# Patient Record
Sex: Male | Born: 1985 | Race: White | Hispanic: No | Marital: Married | Smoking: Never smoker
Health system: Southern US, Community
[De-identification: ages and names within clinical notes are randomized; demographics above are authoritative.]

## PROBLEM LIST (undated history)

## (undated) DIAGNOSIS — S60459A Superficial foreign body of unspecified finger, initial encounter: Secondary | ICD-10-CM

## (undated) DIAGNOSIS — R51 Headache: Secondary | ICD-10-CM

## (undated) HISTORY — PX: NO PAST SURGERIES: SHX2092

---

## 2006-09-05 ENCOUNTER — Emergency Department (HOSPITAL_COMMUNITY): Admission: EM | Admit: 2006-09-05 | Discharge: 2006-09-06 | Payer: Self-pay | Admitting: Emergency Medicine

## 2006-12-19 ENCOUNTER — Observation Stay (HOSPITAL_COMMUNITY): Admission: EM | Admit: 2006-12-19 | Discharge: 2006-12-20 | Payer: Self-pay | Admitting: Emergency Medicine

## 2006-12-19 ENCOUNTER — Ambulatory Visit: Payer: Self-pay | Admitting: Psychiatry

## 2008-12-29 ENCOUNTER — Emergency Department (HOSPITAL_COMMUNITY): Admission: EM | Admit: 2008-12-29 | Discharge: 2008-12-29 | Payer: Self-pay | Admitting: Emergency Medicine

## 2009-04-01 ENCOUNTER — Emergency Department (HOSPITAL_COMMUNITY): Admission: EM | Admit: 2009-04-01 | Discharge: 2009-04-01 | Payer: Self-pay | Admitting: Emergency Medicine

## 2011-03-31 LAB — URINALYSIS, ROUTINE W REFLEX MICROSCOPIC
Glucose, UA: NEGATIVE mg/dL
Nitrite: NEGATIVE
Protein, ur: NEGATIVE mg/dL
Specific Gravity, Urine: 1.004 — ABNORMAL LOW (ref 1.005–1.030)
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
## 2011-05-02 NOTE — Discharge Summary (Signed)
NAME:  Phillip Patrick, Phillip Patrick NO.:  [id number]   MEDICAL RECORD NO.:  [id number]          PATIENT TYPE:  OBV   LOCATION:  2039                         FACILITY:  [facility]   PHYSICIAN:  Madaline Savage, MD        DATE OF BIRTH:  1986-01-29   DATE OF ADMISSION:  12/19/2006  DATE OF DISCHARGE:  12/20/2006                               DISCHARGE SUMMARY   PRIMARY CARE PHYSICIAN:  The patient is unassigned to [place].   PSYCHIATRY:  Dr. Electa Sniff saw from Psychiatry.   DISCHARGE DIAGNOSES:  1. Accidental heroin overdose.  2. Heroin dependence.  3. Leukocytosis which has resolved.   DISCHARGE MEDICATIONS:  None.   HISTORY OF PRESENT ILLNESS:  Mr. Duce is a 25 year old gentleman who  has a history of heroin dependence and IV drug abuse who was admitted  after he was found unresponsive.  En route he was given Narcan and he  woke up.  He has been a heroin addict in the past and he has gone  through heroin detox in November, 2007, for a month.  At this time he  says he was not suicidal.  He tried to take his normal dose of heroin  but he thinks it was a more pure form and that is why it affected him.   PROBLEM LIST:  1. Heroin overdose.  Since admission he has been more awake and now he      is back to his baseline, his breathing is at normal and he is      stable from a medical standpoint.  2. Leukocytosis.  On admission he had a slightly elevated white count      which has resolved to normal.  His x-ray chest was negative.   DISPOSITION:  1. He is now being discharged home.  2. The family is taking him to [facility] for inpatient      rehab.  They will take him directly from here to [facility] for      an interview and possibly get him admitted there tonight.  3. He will follow with his regular doctors with his regular      appointment.      Madaline Savage, MD  Electronically Signed     PKN/MEDQ  D:  12/20/2006  T:  12/20/2006  Job:  [id number]

## 2012-08-15 DIAGNOSIS — S60459A Superficial foreign body of unspecified finger, initial encounter: Secondary | ICD-10-CM

## 2012-08-15 HISTORY — DX: Superficial foreign body of unspecified finger, initial encounter: S60.459A

## 2012-08-20 ENCOUNTER — Encounter (HOSPITAL_BASED_OUTPATIENT_CLINIC_OR_DEPARTMENT_OTHER): Payer: Self-pay | Admitting: *Deleted

## 2012-08-24 ENCOUNTER — Other Ambulatory Visit: Payer: Self-pay | Admitting: Orthopedic Surgery

## 2012-08-27 ENCOUNTER — Encounter (HOSPITAL_BASED_OUTPATIENT_CLINIC_OR_DEPARTMENT_OTHER): Admission: RE | Payer: Self-pay | Source: Ambulatory Visit

## 2012-08-27 ENCOUNTER — Ambulatory Visit (HOSPITAL_BASED_OUTPATIENT_CLINIC_OR_DEPARTMENT_OTHER)
Admission: RE | Admit: 2012-08-27 | Payer: Commercial Managed Care - PPO | Source: Ambulatory Visit | Admitting: Orthopedic Surgery

## 2012-08-27 HISTORY — DX: Headache: R51

## 2012-08-27 HISTORY — DX: Superficial foreign body of unspecified finger, initial encounter: S60.459A

## 2012-08-27 SURGERY — FOREIGN BODY REMOVAL ADULT
Anesthesia: Choice | Laterality: Left

## 2017-04-23 DIAGNOSIS — R0981 Nasal congestion: Secondary | ICD-10-CM | POA: Diagnosis not present

## 2017-04-23 DIAGNOSIS — R05 Cough: Secondary | ICD-10-CM | POA: Diagnosis not present

## 2017-04-23 DIAGNOSIS — J00 Acute nasopharyngitis [common cold]: Secondary | ICD-10-CM | POA: Diagnosis not present

## 2018-06-24 DIAGNOSIS — K625 Hemorrhage of anus and rectum: Secondary | ICD-10-CM | POA: Diagnosis not present

## 2018-09-29 ENCOUNTER — Emergency Department: Payer: Commercial Managed Care - PPO

## 2018-09-29 ENCOUNTER — Encounter: Payer: Self-pay | Admitting: Emergency Medicine

## 2018-09-29 ENCOUNTER — Emergency Department
Admission: EM | Admit: 2018-09-29 | Discharge: 2018-09-29 | Disposition: A | Discharge status: Home / Self Care | Payer: Commercial Managed Care - PPO | Attending: Emergency Medicine | Admitting: Emergency Medicine

## 2018-09-29 DIAGNOSIS — R202 Paresthesia of skin: Secondary | ICD-10-CM | POA: Diagnosis not present

## 2018-09-29 DIAGNOSIS — R0602 Shortness of breath: Secondary | ICD-10-CM | POA: Insufficient documentation

## 2018-09-29 DIAGNOSIS — R0789 Other chest pain: Secondary | ICD-10-CM | POA: Diagnosis not present

## 2018-09-29 DIAGNOSIS — R079 Chest pain, unspecified: Secondary | ICD-10-CM | POA: Diagnosis not present

## 2018-09-29 DIAGNOSIS — R531 Weakness: Secondary | ICD-10-CM | POA: Diagnosis not present

## 2018-09-29 LAB — CBC
HCT: 49.1 % (ref 39.0–52.0)
Hemoglobin: 16.5 g/dL (ref 13.0–17.0)
MCH: 30.2 pg (ref 26.0–34.0)
MCHC: 33.6 g/dL (ref 30.0–36.0)
MCV: 89.9 fL (ref 80.0–100.0)
PLATELETS: 231 10*3/uL (ref 150–400)
RBC: 5.46 MIL/uL (ref 4.22–5.81)
RDW: 11.7 % (ref 11.5–15.5)
WBC: 6.8 10*3/uL (ref 4.0–10.5)
nRBC: 0 % (ref 0.0–0.2)

## 2018-09-29 LAB — TROPONIN I: Troponin I: <0.03 ng/mL (ref <0.03)

## 2018-09-29 LAB — BASIC METABOLIC PANEL
ANION GAP: 10 (ref 5–15)
BUN: 12 mg/dL (ref 6–20)
CALCIUM: 9.2 mg/dL (ref 8.9–10.3)
CO2: 28 mmol/L (ref 22–32)
Chloride: 104 mmol/L (ref 98–111)
Creatinine, Ser: 1.27 mg/dL — ABNORMAL HIGH (ref 0.61–1.24)
GFR calc Af Amer: >60 mL/min (ref >60)
GLUCOSE: 113 mg/dL — AB (ref 70–99)
Potassium: 4.1 mmol/L (ref 3.5–5.1)
SODIUM: 142 mmol/L (ref 135–145)

## 2018-09-29 NOTE — ED Triage Notes (Signed)
Patient presents to the ED with chest pain that began yesterday evening and has been intermittent since then.  Patient states he has had some numbness to his left hand. Patient describes chest pain as "tightness".  Patient reports intermittent headache as well.  Patient states, "I've always had a suspicion that I might have multiple sclerosis because I've looked up the symptoms and I have all of them.  Patient reports frequent urination, inability to empty bowels and feeling pins and needles."  Patient states symptoms have been progressing over the past 10 years.
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
## 2018-09-29 NOTE — ED Notes (Signed)
Pt c/o chest tightness that started yesterday with tingling in face, hands and feet with SOB intermittently. Pt is in NAD upon arrival to the, respirations WNL. Skin is warm and dry.

## 2018-10-01 DIAGNOSIS — Z87821 Personal history of retained foreign body fully removed: Secondary | ICD-10-CM | POA: Diagnosis not present

## 2018-10-01 DIAGNOSIS — S60453A Superficial foreign body of left middle finger, initial encounter: Secondary | ICD-10-CM | POA: Diagnosis not present

## 2018-10-04 DIAGNOSIS — R002 Palpitations: Secondary | ICD-10-CM | POA: Diagnosis not present

## 2018-10-04 DIAGNOSIS — K921 Melena: Secondary | ICD-10-CM | POA: Diagnosis not present

## 2018-10-18 DIAGNOSIS — K219 Gastro-esophageal reflux disease without esophagitis: Secondary | ICD-10-CM | POA: Diagnosis not present

## 2018-10-18 DIAGNOSIS — K921 Melena: Secondary | ICD-10-CM | POA: Diagnosis not present

## 2018-11-08 DIAGNOSIS — K2930 Chronic superficial gastritis without bleeding: Secondary | ICD-10-CM | POA: Diagnosis not present

## 2018-11-08 DIAGNOSIS — K921 Melena: Secondary | ICD-10-CM | POA: Diagnosis not present

## 2018-11-08 DIAGNOSIS — D125 Benign neoplasm of sigmoid colon: Secondary | ICD-10-CM | POA: Diagnosis not present

## 2018-11-08 DIAGNOSIS — K219 Gastro-esophageal reflux disease without esophagitis: Secondary | ICD-10-CM | POA: Diagnosis not present

## 2018-11-14 DIAGNOSIS — R202 Paresthesia of skin: Secondary | ICD-10-CM

## 2018-11-14 DIAGNOSIS — R200 Anesthesia of skin: Secondary | ICD-10-CM | POA: Insufficient documentation

## 2018-11-14 DIAGNOSIS — R42 Dizziness and giddiness: Secondary | ICD-10-CM | POA: Insufficient documentation

## 2018-11-14 DIAGNOSIS — R079 Chest pain, unspecified: Secondary | ICD-10-CM | POA: Insufficient documentation

## 2018-11-14 NOTE — Progress Notes (Deleted)
NO SHOW

## 2018-11-15 ENCOUNTER — Ambulatory Visit: Payer: Commercial Managed Care - PPO | Admitting: Cardiovascular Disease

## 2018-11-16 ENCOUNTER — Encounter: Payer: Self-pay | Admitting: Cardiovascular Disease

## 2019-04-18 DIAGNOSIS — J310 Chronic rhinitis: Secondary | ICD-10-CM | POA: Diagnosis not present

## 2019-04-18 DIAGNOSIS — R509 Fever, unspecified: Secondary | ICD-10-CM | POA: Diagnosis not present

## 2019-04-18 DIAGNOSIS — J029 Acute pharyngitis, unspecified: Secondary | ICD-10-CM | POA: Diagnosis not present

## 2019-04-22 DIAGNOSIS — R509 Fever, unspecified: Secondary | ICD-10-CM | POA: Diagnosis not present

## 2019-04-22 DIAGNOSIS — R6889 Other general symptoms and signs: Secondary | ICD-10-CM | POA: Diagnosis not present

## 2019-04-22 DIAGNOSIS — R05 Cough: Secondary | ICD-10-CM | POA: Diagnosis not present

## 2019-04-24 ENCOUNTER — Other Ambulatory Visit: Payer: Self-pay

## 2019-04-24 ENCOUNTER — Emergency Department
Admission: EM | Admit: 2019-04-24 | Discharge: 2019-04-25 | Disposition: A | Discharge status: Home / Self Care | Payer: Commercial Managed Care - PPO | Attending: Emergency Medicine | Admitting: Emergency Medicine

## 2019-04-24 ENCOUNTER — Encounter: Payer: Self-pay | Admitting: Emergency Medicine

## 2019-04-24 ENCOUNTER — Emergency Department: Payer: Commercial Managed Care - PPO

## 2019-04-24 DIAGNOSIS — R0602 Shortness of breath: Secondary | ICD-10-CM | POA: Diagnosis not present

## 2019-04-24 DIAGNOSIS — Z20828 Contact with and (suspected) exposure to other viral communicable diseases: Secondary | ICD-10-CM | POA: Insufficient documentation

## 2019-04-24 DIAGNOSIS — R109 Unspecified abdominal pain: Secondary | ICD-10-CM | POA: Diagnosis not present

## 2019-04-24 DIAGNOSIS — B9721 SARS-associated coronavirus as the cause of diseases classified elsewhere: Secondary | ICD-10-CM | POA: Insufficient documentation

## 2019-04-24 DIAGNOSIS — R509 Fever, unspecified: Secondary | ICD-10-CM

## 2019-04-24 DIAGNOSIS — B349 Viral infection, unspecified: Secondary | ICD-10-CM | POA: Diagnosis not present

## 2019-04-24 DIAGNOSIS — R42 Dizziness and giddiness: Secondary | ICD-10-CM

## 2019-04-24 DIAGNOSIS — R05 Cough: Secondary | ICD-10-CM

## 2019-04-24 DIAGNOSIS — R079 Chest pain, unspecified: Secondary | ICD-10-CM

## 2019-04-24 DIAGNOSIS — R059 Cough, unspecified: Secondary | ICD-10-CM

## 2019-04-24 DIAGNOSIS — U071 COVID-19: Secondary | ICD-10-CM | POA: Diagnosis not present

## 2019-04-24 LAB — BASIC METABOLIC PANEL
Anion gap: 9 (ref 5–15)
BUN: 11 mg/dL (ref 6–20)
CO2: 28 mmol/L (ref 22–32)
Calcium: 8.1 mg/dL — ABNORMAL LOW (ref 8.9–10.3)
Chloride: 101 mmol/L (ref 98–111)
Creatinine, Ser: 1.17 mg/dL (ref 0.61–1.24)
GFR calc Af Amer: >60 mL/min (ref >60)
GFR calc non Af Amer: >60 mL/min (ref >60)
Glucose, Bld: 128 mg/dL — ABNORMAL HIGH (ref 70–99)
Potassium: 3.5 mmol/L (ref 3.5–5.1)
Sodium: 138 mmol/L (ref 135–145)

## 2019-04-24 LAB — CBC
HCT: 46.3 % (ref 39.0–52.0)
Hemoglobin: 15.2 g/dL (ref 13.0–17.0)
MCH: 30.2 pg (ref 26.0–34.0)
MCHC: 32.8 g/dL (ref 30.0–36.0)
MCV: 91.9 fL (ref 80.0–100.0)
Platelets: 161 10*3/uL (ref 150–400)
RBC: 5.04 MIL/uL (ref 4.22–5.81)
RDW: 11.7 % (ref 11.5–15.5)
WBC: 6.8 10*3/uL (ref 4.0–10.5)
nRBC: 0 % (ref 0.0–0.2)

## 2019-04-24 LAB — TROPONIN I: Troponin I: <0.03 ng/mL (ref <0.03)

## 2019-04-24 LAB — SARS CORONAVIRUS 2 BY RT PCR (HOSPITAL ORDER, PERFORMED IN ~~LOC~~ HOSPITAL LAB): SARS Coronavirus 2: POSITIVE — AB

## 2019-04-24 MED ORDER — HYDROCOD POLST-CPM POLST ER 10-8 MG/5ML PO SUER: 5.0000 mL | Freq: Two times a day (BID) | ORAL | 0 refills | Status: DC

## 2019-04-24 MED ORDER — SODIUM CHLORIDE 0.9% FLUSH: 3.0000 mL | Freq: Once | INTRAVENOUS | Status: DC

## 2019-04-24 MED ORDER — SODIUM CHLORIDE 0.9 % IV SOLN
Freq: Once | INTRAVENOUS | Status: AC
  Administered 2019-04-24: 23:00:00 via INTRAVENOUS

## 2019-04-24 MED ORDER — KETOROLAC TROMETHAMINE 30 MG/ML IJ SOLN
30.0000 mg | Freq: Once | INTRAMUSCULAR | Status: AC
  Administered 2019-04-24: 30 mg via INTRAVENOUS
  Filled 2019-04-24: qty 1

## 2019-04-24 MED ORDER — ONDANSETRON HCL 4 MG/2ML IJ SOLN
4.0000 mg | Freq: Once | INTRAMUSCULAR | Status: AC
  Administered 2019-04-24: 23:00:00 4 mg via INTRAVENOUS
  Filled 2019-04-24: qty 2

## 2019-04-24 NOTE — ED Notes (Signed)
Patient is waiting for NS bolus to finish and results of Covid - 19 test before being discharged.

## 2019-04-24 NOTE — ED Triage Notes (Addendum)
Pt reports dizziness, shortness of breath and abd pain that started 1 week ago; temp earlier at home was 100.4; unknown exposure to Covid 19; pt also has nonproductive cough; pt took tylenol around 6pm; last ibuprofen was 1-2pm; pt awake and alert; talking in complete coherent sentences; pt says he was tested for Covid 19 on Friday but does not know results; pt adds in the middle of triage that he has also had intermittent chest pain to the center of his chest and diaphoresis
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
## 2019-04-24 NOTE — ED Notes (Signed)
Dr. Beather Arbour aware of Covid-19 positive swab.

## 2019-04-25 NOTE — ED Notes (Signed)
Patient witnessed signing [employer] paper. Patient took that paper with him.

## 2019-04-25 NOTE — ED Notes (Signed)
Patient left with mask on and information sheet about Covid-19 precautions.

## 2019-05-18 ENCOUNTER — Telehealth: Payer: Self-pay | Admitting: *Deleted

## 2019-05-18 NOTE — Telephone Encounter (Signed)
I called pt to see if he would be willing to donate plasma since he is recovered from the COVID-19 to assist in the recovery in others with the virus.  I got his voicemail.   No message left.   I will try again.

## 2020-04-11 ENCOUNTER — Encounter (HOSPITAL_COMMUNITY): Payer: Self-pay | Admitting: *Deleted

## 2020-04-11 ENCOUNTER — Emergency Department (HOSPITAL_COMMUNITY)
Admission: EM | Admit: 2020-04-11 | Discharge: 2020-04-11 | Disposition: A | Discharge status: Home / Self Care | Payer: 59 | Attending: Emergency Medicine | Admitting: Emergency Medicine

## 2020-04-11 ENCOUNTER — Other Ambulatory Visit: Payer: Self-pay

## 2020-04-11 DIAGNOSIS — R531 Weakness: Secondary | ICD-10-CM | POA: Insufficient documentation

## 2020-04-11 DIAGNOSIS — Z5321 Procedure and treatment not carried out due to patient leaving prior to being seen by health care provider: Secondary | ICD-10-CM | POA: Diagnosis not present

## 2020-04-11 DIAGNOSIS — R42 Dizziness and giddiness: Secondary | ICD-10-CM | POA: Diagnosis not present

## 2020-04-11 LAB — BASIC METABOLIC PANEL
Anion gap: 11 (ref 5–15)
BUN: 15 mg/dL (ref 6–20)
CO2: 29 mmol/L (ref 22–32)
Calcium: 8.7 mg/dL — ABNORMAL LOW (ref 8.9–10.3)
Chloride: 103 mmol/L (ref 98–111)
Creatinine, Ser: 1.37 mg/dL — ABNORMAL HIGH (ref 0.61–1.24)
GFR calc Af Amer: >60 mL/min (ref >60)
GFR calc non Af Amer: >60 mL/min (ref >60)
Glucose, Bld: 118 mg/dL — ABNORMAL HIGH (ref 70–99)
Potassium: 4.1 mmol/L (ref 3.5–5.1)
Sodium: 143 mmol/L (ref 135–145)

## 2020-04-11 LAB — CBC
HCT: 43.5 % (ref 39.0–52.0)
Hemoglobin: 14.1 g/dL (ref 13.0–17.0)
MCH: 30.3 pg (ref 26.0–34.0)
MCHC: 32.4 g/dL (ref 30.0–36.0)
MCV: 93.5 fL (ref 80.0–100.0)
Platelets: 233 10*3/uL (ref 150–400)
RBC: 4.65 MIL/uL (ref 4.22–5.81)
RDW: 12.2 % (ref 11.5–15.5)
WBC: 6.1 10*3/uL (ref 4.0–10.5)
nRBC: 0 % (ref 0.0–0.2)

## 2020-04-11 MED ORDER — SODIUM CHLORIDE 0.9% FLUSH: 3.0000 mL | Freq: Once | INTRAVENOUS | Status: DC

## 2020-04-11 NOTE — ED Notes (Signed)
No answer x2 for vitals recheck 

## 2020-04-11 NOTE — ED Notes (Signed)
No answer x3

## 2020-04-11 NOTE — ED Triage Notes (Signed)
Pt reports feeling weak, lightheaded and dizzy since 1200. No neuro deficits are noted at triage.

## 2020-04-11 NOTE — ED Notes (Signed)
No answer x1 for vitals recheck 

## 2020-06-10 ENCOUNTER — Emergency Department
Admission: EM | Admit: 2020-06-10 | Discharge: 2020-06-10 | Disposition: A | Discharge status: Home / Self Care | Payer: 59 | Attending: Emergency Medicine | Admitting: Emergency Medicine

## 2020-06-10 ENCOUNTER — Other Ambulatory Visit: Payer: Self-pay

## 2020-06-10 ENCOUNTER — Emergency Department: Payer: 59

## 2020-06-10 DIAGNOSIS — R6883 Chills (without fever): Secondary | ICD-10-CM

## 2020-06-10 DIAGNOSIS — Z79899 Other long term (current) drug therapy: Secondary | ICD-10-CM | POA: Diagnosis not present

## 2020-06-10 DIAGNOSIS — R410 Disorientation, unspecified: Secondary | ICD-10-CM | POA: Insufficient documentation

## 2020-06-10 DIAGNOSIS — R5383 Other fatigue: Secondary | ICD-10-CM | POA: Insufficient documentation

## 2020-06-10 LAB — CBC WITH DIFFERENTIAL/PLATELET
Abs Immature Granulocytes: 0.02 10*3/uL (ref 0.00–0.07)
Basophils Absolute: 0.1 10*3/uL (ref 0.0–0.1)
Basophils Relative: 1 %
Eosinophils Absolute: 0.3 10*3/uL (ref 0.0–0.5)
Eosinophils Relative: 4 %
HCT: 45.4 % (ref 39.0–52.0)
Hemoglobin: 15.3 g/dL (ref 13.0–17.0)
Immature Granulocytes: 0 %
Lymphocytes Relative: 36 %
Lymphs Abs: 3.2 10*3/uL (ref 0.7–4.0)
MCH: 30.4 pg (ref 26.0–34.0)
MCHC: 33.7 g/dL (ref 30.0–36.0)
MCV: 90.3 fL (ref 80.0–100.0)
Monocytes Absolute: 0.5 10*3/uL (ref 0.1–1.0)
Monocytes Relative: 6 %
Neutro Abs: 4.7 10*3/uL (ref 1.7–7.7)
Neutrophils Relative %: 53 %
Platelets: 256 10*3/uL (ref 150–400)
RBC: 5.03 MIL/uL (ref 4.22–5.81)
RDW: 11.9 % (ref 11.5–15.5)
WBC: 8.8 10*3/uL (ref 4.0–10.5)
nRBC: 0 % (ref 0.0–0.2)

## 2020-06-10 LAB — URINALYSIS, COMPLETE (UACMP) WITH MICROSCOPIC
Bacteria, UA: NONE SEEN
Bilirubin Urine: NEGATIVE
Glucose, UA: NEGATIVE mg/dL
Hgb urine dipstick: NEGATIVE
Ketones, ur: NEGATIVE mg/dL
Leukocytes,Ua: NEGATIVE
Nitrite: NEGATIVE
Protein, ur: NEGATIVE mg/dL
Specific Gravity, Urine: 1.006 (ref 1.005–1.030)
Squamous Epithelial / LPF: NONE SEEN (ref 0–5)
pH: 5 (ref 5.0–8.0)

## 2020-06-10 LAB — COMPREHENSIVE METABOLIC PANEL
ALT: 20 U/L (ref 0–44)
AST: 20 U/L (ref 15–41)
Albumin: 4.6 g/dL (ref 3.5–5.0)
Alkaline Phosphatase: 84 U/L (ref 38–126)
Anion gap: 10 (ref 5–15)
BUN: 19 mg/dL (ref 6–20)
CO2: 27 mmol/L (ref 22–32)
Calcium: 8.8 mg/dL — ABNORMAL LOW (ref 8.9–10.3)
Chloride: 104 mmol/L (ref 98–111)
Creatinine, Ser: 1.32 mg/dL — ABNORMAL HIGH (ref 0.61–1.24)
GFR calc Af Amer: >60 mL/min (ref >60)
GFR calc non Af Amer: >60 mL/min (ref >60)
Glucose, Bld: 136 mg/dL — ABNORMAL HIGH (ref 70–99)
Potassium: 4 mmol/L (ref 3.5–5.1)
Sodium: 141 mmol/L (ref 135–145)
Total Bilirubin: 0.7 mg/dL (ref 0.3–1.2)
Total Protein: 7.7 g/dL (ref 6.5–8.1)

## 2020-06-10 LAB — LACTIC ACID, PLASMA: Lactic Acid, Venous: 1.9 mmol/L (ref 0.5–1.9)

## 2020-06-10 LAB — GLUCOSE, CAPILLARY: Glucose-Capillary: 126 mg/dL — ABNORMAL HIGH (ref 70–99)

## 2020-06-10 MED ORDER — ONDANSETRON 4 MG PO TBDP: 4.0000 mg | ORAL_TABLET | Freq: Three times a day (TID) | ORAL | 0 refills | Status: AC | PRN

## 2020-06-10 MED ORDER — NAPROXEN 500 MG PO TABS
500.0000 mg | ORAL_TABLET | Freq: Two times a day (BID) | ORAL | 0 refills | Status: DC | PRN
Start: 2020-06-10 — End: 2021-01-03

## 2020-06-10 NOTE — ED Notes (Signed)
Pt verbalized understanding of discharge instructions. NAD at this time. 

## 2020-06-10 NOTE — ED Triage Notes (Signed)
Pt states has felt cold and confused since last night. Pt denies pain, fever, cough, headache, diarrhea, vomiting. Pt appears in no acute distress. Pt denies dysuria.
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
## 2020-06-10 NOTE — ED Notes (Signed)
Pt states feels some improvement from symptoms that started last night. Pt states he drank some coffee and was out late, upon returning home he felt "super confused". Pt is afebrile at this time a 98.2. Pt is A&O x4. Pt is groggy and falling asleep during assessment.
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
## 2021-01-03 ENCOUNTER — Ambulatory Visit (INDEPENDENT_AMBULATORY_CARE_PROVIDER_SITE_OTHER): Payer: 59

## 2021-01-03 ENCOUNTER — Encounter: Payer: Self-pay | Admitting: Cardiology

## 2021-01-03 ENCOUNTER — Ambulatory Visit (INDEPENDENT_AMBULATORY_CARE_PROVIDER_SITE_OTHER): Payer: 59 | Admitting: Cardiology

## 2021-01-03 ENCOUNTER — Other Ambulatory Visit: Payer: Self-pay

## 2021-01-03 ENCOUNTER — Other Ambulatory Visit: Payer: Self-pay | Admitting: Cardiology

## 2021-01-03 ENCOUNTER — Encounter: Payer: Self-pay | Admitting: *Deleted

## 2021-01-03 VITALS — BP 128/83 | HR 90 | Ht 71.0 in | Wt 257.8 lb

## 2021-01-03 DIAGNOSIS — R9431 Abnormal electrocardiogram [ECG] [EKG]: Secondary | ICD-10-CM

## 2021-01-03 DIAGNOSIS — R42 Dizziness and giddiness: Secondary | ICD-10-CM

## 2021-01-03 DIAGNOSIS — R079 Chest pain, unspecified: Secondary | ICD-10-CM | POA: Diagnosis not present

## 2021-01-03 NOTE — Progress Notes (Signed)
Patient ID: Phillip Patrick, male   DOB: 1986/10/03, 35 y.o.   MRN: [id number] Patient enrolled for [employer] to ship a 3 day ZIO XT long term holter monitor to his home.

## 2021-01-03 NOTE — Patient Instructions (Signed)
Medication Instructions:  The current medical regimen is effective;  continue present plan and medications.  *If you need a refill on your cardiac medications before your next appointment, please call your pharmacy*  Testing/Procedures: ZIO XT- Long Term Monitor Instructions   Your physician has requested you wear your ZIO patch monitor_______days.   This is a single patch monitor.  [employer] supplies one patch monitor per enrollment.  Additional stickers are not available.   Please do not apply patch if you will be having a Nuclear Stress Test, Echocardiogram, Cardiac CT, MRI, or Chest Xray during the time frame you would be wearing the monitor. The patch cannot be worn during these tests.  You cannot remove and re-apply the ZIO XT patch monitor.   Your ZIO patch monitor will be sent USPS Priority mail from [facility] directly to your home address. The monitor may also be mailed to a PO BOX if home delivery is not available.   It may take 3-5 days to receive your monitor after you have been enrolled.   Once you have received you monitor, please review enclosed instructions.  Your monitor has already been registered assigning a specific monitor serial # to you.   Applying the monitor   Shave hair from upper left chest.   Hold abrader disc by orange tab.  Rub abrader in 40 strokes over left upper chest as indicated in your monitor instructions.   Clean area with 4 enclosed alcohol pads .  Use all pads to assure are is cleaned thoroughly.  Let dry.   Apply patch as indicated in monitor instructions.  Patch will be place under collarbone on left side of chest with arrow pointing upward.   Rub patch adhesive wings for 2 minutes.Remove white label marked "1".  Remove white label marked "2".  Rub patch adhesive wings for 2 additional minutes.   While looking in a mirror, press and release button in center of patch.  A small green light will flash 3-4 times .  This will be your only  indicator the monitor has been turned on.     Do not shower for the first 24 hours.  You may shower after the first 24 hours.   Press button if you feel a symptom. You will hear a small click.  Record Date, Time and Symptom in the Patient Log Book.   When you are ready to remove patch, follow instructions on last 2 pages of Patient Log Book.  Stick patch monitor onto last page of Patient Log Book.   Place Patient Log Book in [place] box.  Use locking tab on box and tape box closed securely.  The [employer] and [employer] has [employer] on it.  Please place in mailbox as soon as possible.  Your physician should have your test results approximately 7 days after the monitor has been mailed back to [facility].   Call [place] at [phone] if you have questions regarding your ZIO XT patch monitor.  Call them immediately if you see an orange light blinking on your monitor.   If your monitor falls off in less than 4 days contact our Monitor department at [phone].  If your monitor becomes loose or falls off after 4 days call [employer] at [phone] for suggestions on securing your monitor.     Follow-Up: At [facility], you and your health needs are our priority.  As part of our continuing mission to provide you with exceptional heart care, we have  created designated Provider Care Teams.  These Care Teams include your primary Cardiologist (physician) and Advanced Practice Providers (APPs -  Physician Assistants and Nurse Practitioners) who all work together to provide you with the care you need, when you need it.  We recommend signing up for the patient portal called "MyChart".  Sign up information is provided on this After Visit Summary.  MyChart is used to connect with patients for Virtual Visits (Telemedicine).  Patients are able to view lab/test results, encounter notes, upcoming appointments, etc.  Non-urgent messages can be sent to your provider as well.   To  learn more about what you can do with MyChart, go to [email or web address].    Your next appointment:   As needed  The format for your next appointment:   In Person  Provider:   Minus Breeding, MD

## 2021-02-04 ENCOUNTER — Telehealth: Payer: Self-pay | Admitting: *Deleted

## 2021-02-04 NOTE — Telephone Encounter (Signed)
We received results from ZIO patch monitor.  Recorded data was short of the ordered 3 day duration.  I am calling to see if you wanted to repeat the test or if you wanted [place] to have Dr. Percival Spanish review only the data that is available.  Please call [phone], monitor department.

## 2021-05-10 IMAGING — CT CT HEAD W/O CM
3 series · 16 of 47 positions shown, 19 images · non-contrast
Comparison: September 29, 2018

CLINICAL DATA: Confusion

EXAM:
CT HEAD WITHOUT CONTRAST
TECHNIQUE: Contiguous axial images were obtained from the base of the skull
through the vertex without intravenous contrast.

[Series 3: head wo · axial · 0.41mm/px · z∈[-99,+26]mm · 10 of 31 slices shown, 13 images]
[im 3/31  brain]
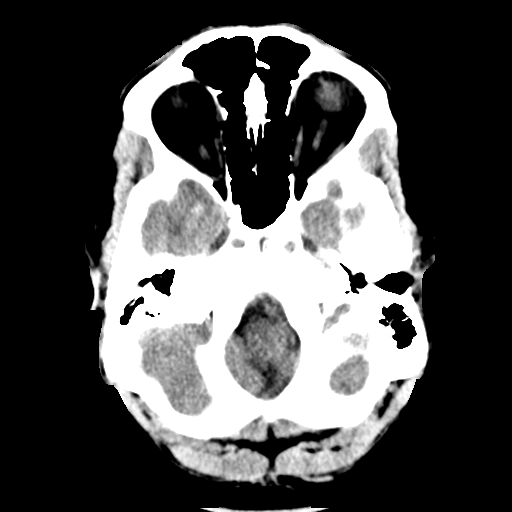
[im 3/31  bone]
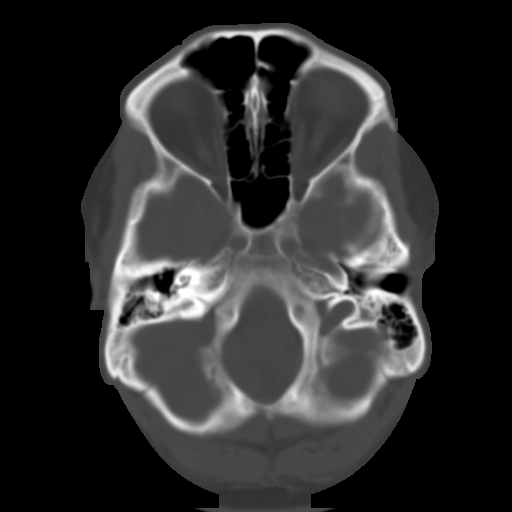
[im 6/31  brain]
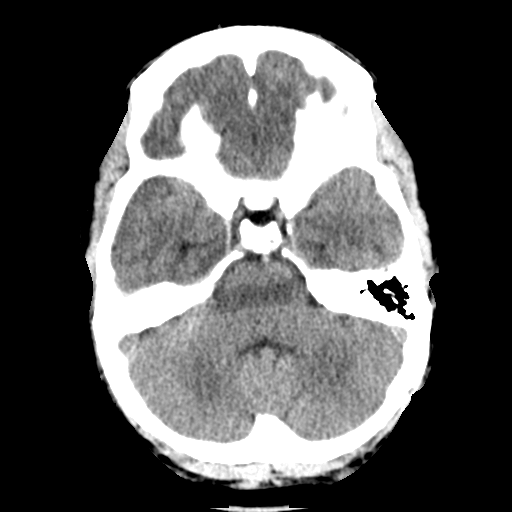
[im 9/31  brain]
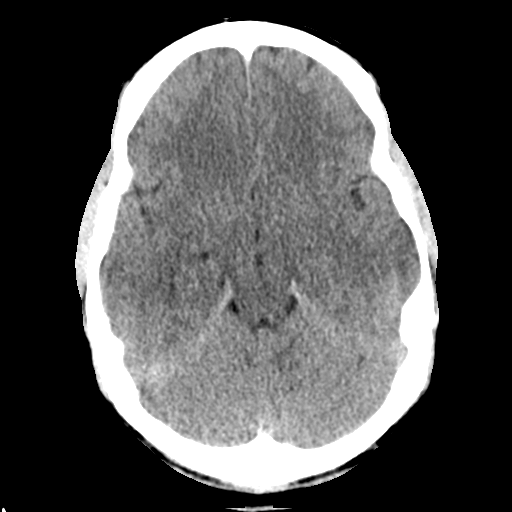
[im 11/31  brain]
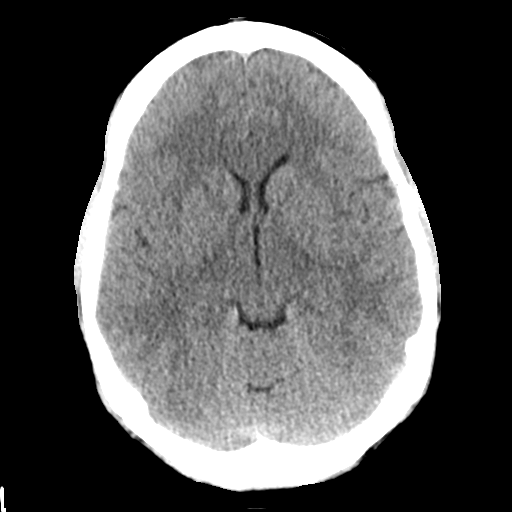
[im 14/31  brain]
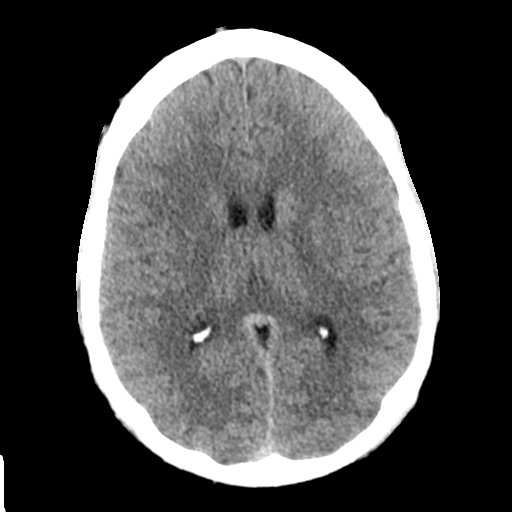
[im 14/31  bone]
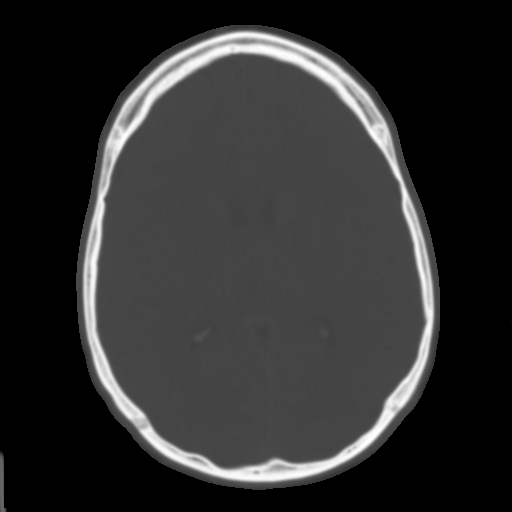
[im 17/31  brain]
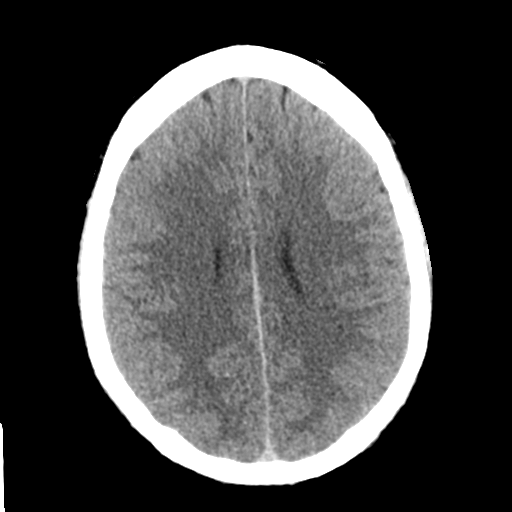
[im 20/31  brain]
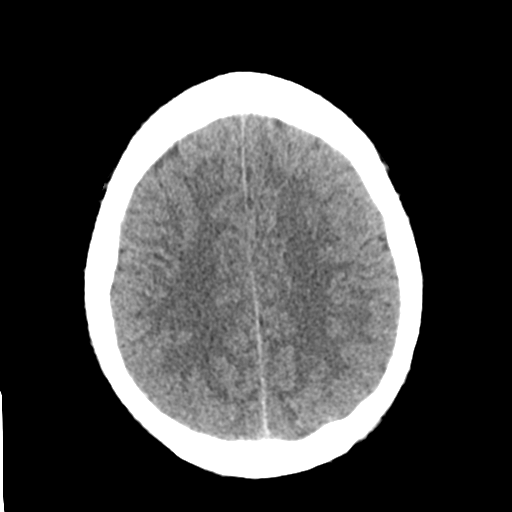
[im 23/31  brain]
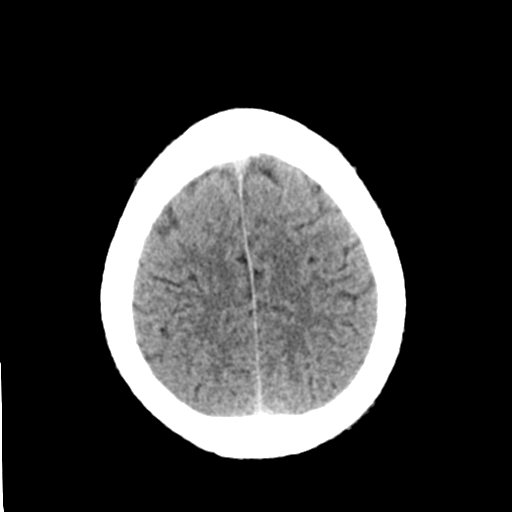
[im 25/31  brain]
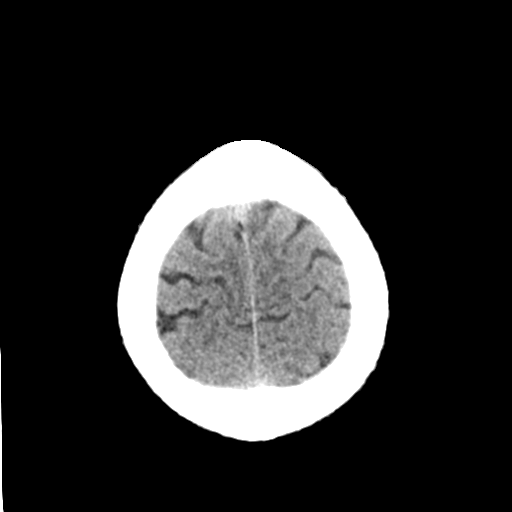
[im 25/31  bone]
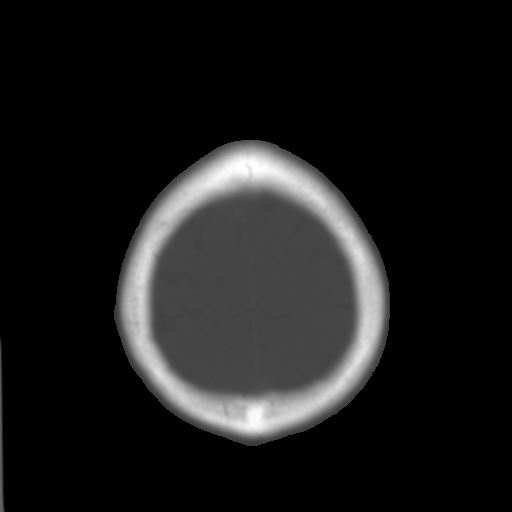
[im 28/31  brain]
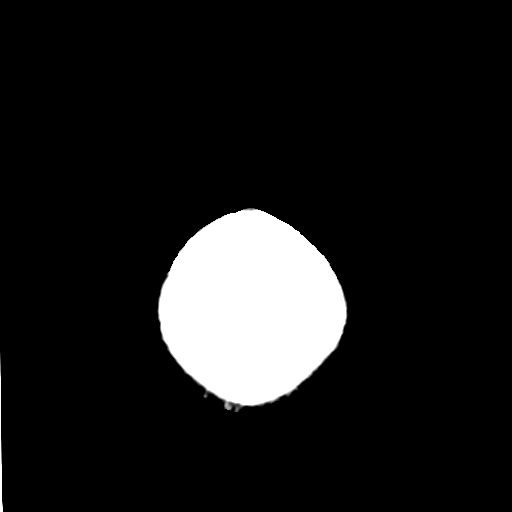

[Series 4: coronal soft tissue · coronal · 0.30mm/px · 3 of 67 slices shown]
[im 23/67  brain]
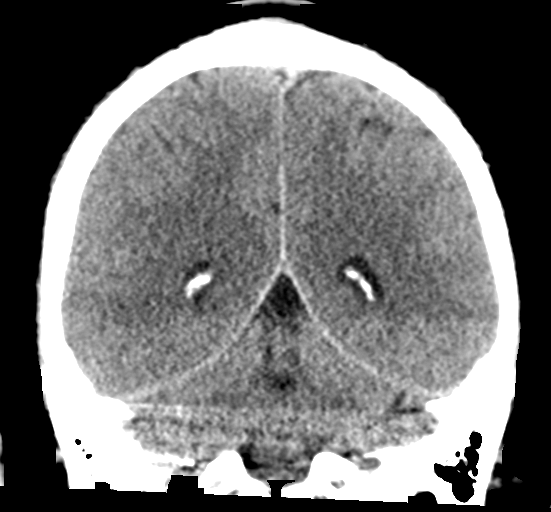
[im 30/67  brain]
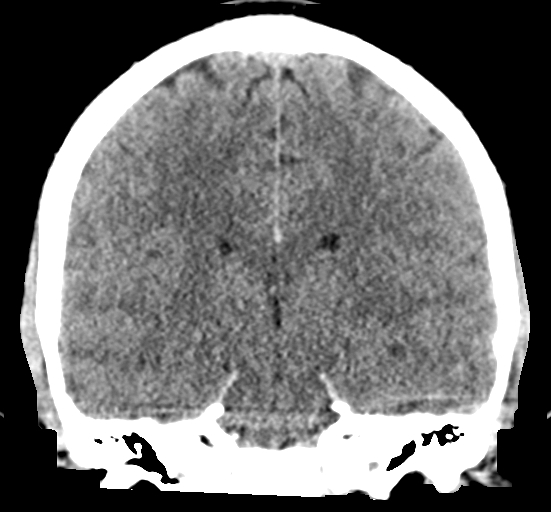
[im 37/67  brain]
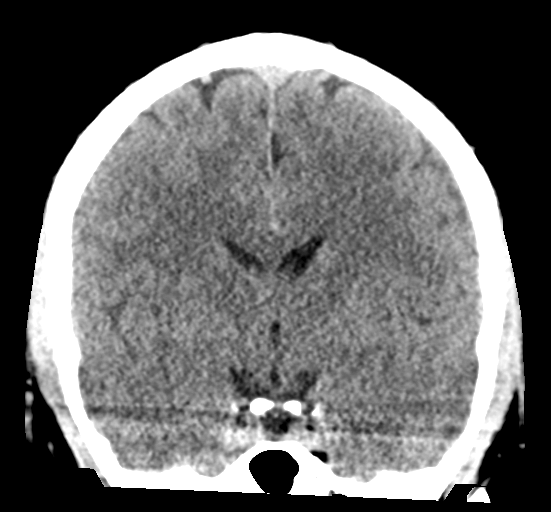

[Series 5: sagittal soft tissue · sagittal · 0.30mm/px · 3 of 57 slices shown]
[im 19/57  brain]
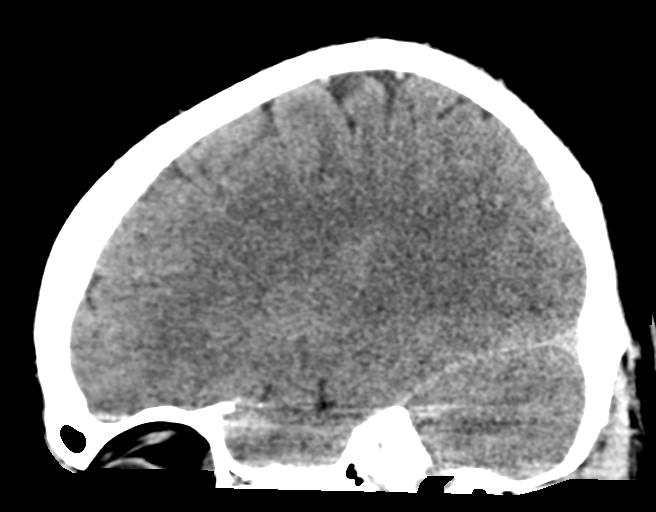
[im 29/57  brain]
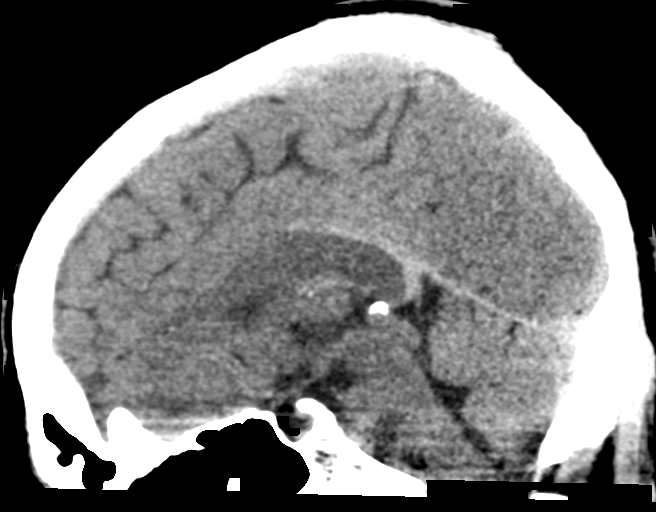
[im 38/57  brain]
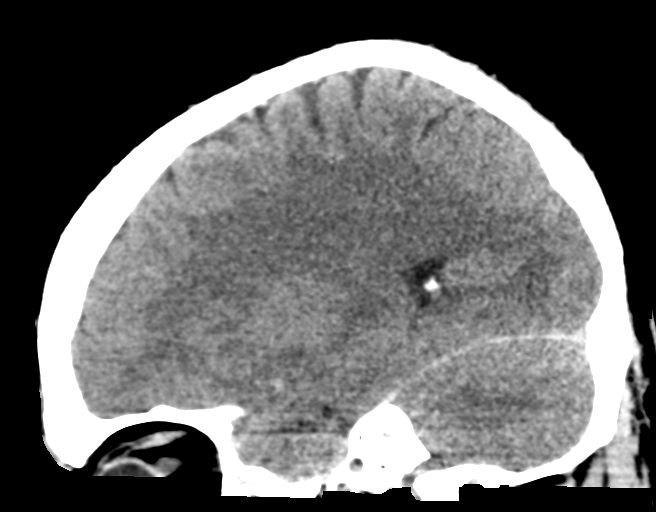

[16 of 47 positions shown; findings below may reference images not displayed]

FINDINGS: Brain: No evidence of acute infarction, hemorrhage, hydrocephalus,
extra-axial collection or mass lesion/mass effect.

Vascular: No hyperdense vessel or unexpected calcification.

Skull: Normal. Negative for fracture or focal lesion.

Sinuses/Orbits: No acute finding.

Other: None.
IMPRESSION: Normal brain.  No cause for symptoms identified.

## 2021-07-12 ENCOUNTER — Other Ambulatory Visit: Payer: Self-pay

## 2021-07-12 ENCOUNTER — Emergency Department
Admission: EM | Admit: 2021-07-12 | Discharge: 2021-07-12 | Disposition: A | Discharge status: Home / Self Care | Payer: 59 | Attending: Emergency Medicine | Admitting: Emergency Medicine

## 2021-07-12 DIAGNOSIS — R1010 Upper abdominal pain, unspecified: Secondary | ICD-10-CM | POA: Diagnosis not present

## 2021-07-12 DIAGNOSIS — Z5321 Procedure and treatment not carried out due to patient leaving prior to being seen by health care provider: Secondary | ICD-10-CM | POA: Insufficient documentation

## 2021-07-12 LAB — CBC
HCT: 45.6 % (ref 39.0–52.0)
Hemoglobin: 15.1 g/dL (ref 13.0–17.0)
MCH: 29.5 pg (ref 26.0–34.0)
MCHC: 33.1 g/dL (ref 30.0–36.0)
MCV: 89.2 fL (ref 80.0–100.0)
Platelets: 237 10*3/uL (ref 150–400)
RBC: 5.11 MIL/uL (ref 4.22–5.81)
RDW: 12 % (ref 11.5–15.5)
WBC: 7.9 10*3/uL (ref 4.0–10.5)
nRBC: 0 % (ref 0.0–0.2)

## 2021-07-12 LAB — COMPREHENSIVE METABOLIC PANEL
ALT: 17 U/L (ref 0–44)
AST: 18 U/L (ref 15–41)
Albumin: 4.4 g/dL (ref 3.5–5.0)
Alkaline Phosphatase: 65 U/L (ref 38–126)
Anion gap: 7 (ref 5–15)
BUN: 17 mg/dL (ref 6–20)
CO2: 29 mmol/L (ref 22–32)
Calcium: 8.8 mg/dL — ABNORMAL LOW (ref 8.9–10.3)
Chloride: 106 mmol/L (ref 98–111)
Creatinine, Ser: 1.15 mg/dL (ref 0.61–1.24)
GFR, Estimated: >60 mL/min (ref >60)
Glucose, Bld: 107 mg/dL — ABNORMAL HIGH (ref 70–99)
Potassium: 3.7 mmol/L (ref 3.5–5.1)
Sodium: 142 mmol/L (ref 135–145)
Total Bilirubin: 0.7 mg/dL (ref 0.3–1.2)
Total Protein: 7.1 g/dL (ref 6.5–8.1)

## 2021-07-12 LAB — URINALYSIS, COMPLETE (UACMP) WITH MICROSCOPIC
Bacteria, UA: NONE SEEN
Bilirubin Urine: NEGATIVE
Glucose, UA: NEGATIVE mg/dL
Hgb urine dipstick: NEGATIVE
Ketones, ur: NEGATIVE mg/dL
Leukocytes,Ua: NEGATIVE
Nitrite: NEGATIVE
Protein, ur: NEGATIVE mg/dL
Specific Gravity, Urine: 1.014 (ref 1.005–1.030)
Squamous Epithelial / HPF: NONE SEEN (ref 0–5)
pH: 6 (ref 5.0–8.0)

## 2021-07-12 LAB — LIPASE, BLOOD: Lipase: 30 U/L (ref 11–51)

## 2021-07-12 NOTE — ED Triage Notes (Signed)
Pt states has had three weeks of upper abd pain. Pt states he also feels "out of it as the day goes on". Pt states that has been going on for three weeks as well. Pt appears in no acute distress, denies vomiting, diarrhea, fever, known covid exposure.

## 2021-07-12 NOTE — ED Notes (Signed)
Pt reports he is leaving and will look at his blood work on his [employer]
